# Patient Record
Sex: Male | Born: 1985 | Race: Black or African American | Hispanic: No | Marital: Married | Smoking: Never smoker
Health system: Southern US, Community
[De-identification: ages and names within clinical notes are randomized; demographics above are authoritative.]

## PROBLEM LIST (undated history)

## (undated) ENCOUNTER — Emergency Department (HOSPITAL_COMMUNITY): Payer: Self-pay | Source: Home / Self Care

## (undated) DIAGNOSIS — I10 Essential (primary) hypertension: Secondary | ICD-10-CM

## (undated) DIAGNOSIS — E291 Testicular hypofunction: Secondary | ICD-10-CM

## (undated) HISTORY — DX: Morbid (severe) obesity due to excess calories: E66.01

## (undated) HISTORY — DX: Testicular hypofunction: E29.1

## (undated) HISTORY — PX: TONSILLECTOMY AND ADENOIDECTOMY: SUR1326

## (undated) HISTORY — DX: Essential (primary) hypertension: I10

---

## 2017-11-02 DIAGNOSIS — H52223 Regular astigmatism, bilateral: Secondary | ICD-10-CM | POA: Diagnosis not present

## 2018-03-15 DIAGNOSIS — R5383 Other fatigue: Secondary | ICD-10-CM | POA: Diagnosis not present

## 2018-03-15 DIAGNOSIS — Z13220 Encounter for screening for lipoid disorders: Secondary | ICD-10-CM | POA: Diagnosis not present

## 2018-03-15 DIAGNOSIS — I10 Essential (primary) hypertension: Secondary | ICD-10-CM | POA: Diagnosis not present

## 2018-03-15 DIAGNOSIS — E559 Vitamin D deficiency, unspecified: Secondary | ICD-10-CM | POA: Diagnosis not present

## 2018-03-15 DIAGNOSIS — R6882 Decreased libido: Secondary | ICD-10-CM | POA: Diagnosis not present

## 2018-03-15 DIAGNOSIS — R7302 Impaired glucose tolerance (oral): Secondary | ICD-10-CM | POA: Diagnosis not present

## 2018-04-02 ENCOUNTER — Encounter (INDEPENDENT_AMBULATORY_CARE_PROVIDER_SITE_OTHER): Payer: Self-pay | Admitting: Internal Medicine

## 2018-04-05 ENCOUNTER — Other Ambulatory Visit (HOSPITAL_COMMUNITY): Payer: Self-pay | Admitting: Internal Medicine

## 2018-04-05 ENCOUNTER — Other Ambulatory Visit: Payer: Self-pay | Admitting: Internal Medicine

## 2018-04-05 DIAGNOSIS — E291 Testicular hypofunction: Secondary | ICD-10-CM | POA: Diagnosis not present

## 2018-04-05 DIAGNOSIS — R6882 Decreased libido: Secondary | ICD-10-CM | POA: Diagnosis not present

## 2018-04-05 DIAGNOSIS — I10 Essential (primary) hypertension: Secondary | ICD-10-CM | POA: Diagnosis not present

## 2018-04-08 ENCOUNTER — Encounter (INDEPENDENT_AMBULATORY_CARE_PROVIDER_SITE_OTHER): Payer: Self-pay | Admitting: Internal Medicine

## 2018-04-29 ENCOUNTER — Telehealth: Payer: Self-pay | Admitting: Internal Medicine

## 2018-05-07 ENCOUNTER — Encounter (HOSPITAL_COMMUNITY): Payer: Self-pay

## 2018-05-07 ENCOUNTER — Ambulatory Visit (HOSPITAL_COMMUNITY): Payer: Self-pay

## 2018-05-26 ENCOUNTER — Ambulatory Visit (HOSPITAL_COMMUNITY)
Admission: RE | Admit: 2018-05-26 | Discharge: 2018-05-26 | Disposition: A | Discharge status: Home / Self Care | Payer: 59 | Source: Ambulatory Visit | Attending: Internal Medicine | Admitting: Internal Medicine

## 2018-05-26 ENCOUNTER — Other Ambulatory Visit: Payer: Self-pay

## 2018-05-26 DIAGNOSIS — E291 Testicular hypofunction: Secondary | ICD-10-CM | POA: Insufficient documentation

## 2018-05-27 ENCOUNTER — Ambulatory Visit (HOSPITAL_COMMUNITY)
Admission: RE | Admit: 2018-05-27 | Discharge: 2018-05-27 | Disposition: A | Discharge status: Home / Self Care | Payer: 59 | Source: Ambulatory Visit | Attending: Internal Medicine | Admitting: Internal Medicine

## 2018-05-27 ENCOUNTER — Other Ambulatory Visit (HOSPITAL_COMMUNITY): Payer: Self-pay | Admitting: Internal Medicine

## 2018-05-27 DIAGNOSIS — E291 Testicular hypofunction: Secondary | ICD-10-CM

## 2018-05-28 DIAGNOSIS — R7302 Impaired glucose tolerance (oral): Secondary | ICD-10-CM | POA: Diagnosis not present

## 2018-05-28 DIAGNOSIS — E291 Testicular hypofunction: Secondary | ICD-10-CM | POA: Diagnosis not present

## 2018-05-28 DIAGNOSIS — I10 Essential (primary) hypertension: Secondary | ICD-10-CM | POA: Diagnosis not present

## 2018-07-05 ENCOUNTER — Ambulatory Visit (INDEPENDENT_AMBULATORY_CARE_PROVIDER_SITE_OTHER): Payer: Self-pay | Admitting: Internal Medicine

## 2018-07-20 DIAGNOSIS — E291 Testicular hypofunction: Secondary | ICD-10-CM | POA: Diagnosis not present

## 2018-07-20 DIAGNOSIS — I10 Essential (primary) hypertension: Secondary | ICD-10-CM | POA: Diagnosis not present

## 2018-07-20 DIAGNOSIS — R5383 Other fatigue: Secondary | ICD-10-CM | POA: Diagnosis not present

## 2018-09-14 ENCOUNTER — Other Ambulatory Visit: Payer: Self-pay

## 2018-09-14 ENCOUNTER — Ambulatory Visit (INDEPENDENT_AMBULATORY_CARE_PROVIDER_SITE_OTHER): Payer: 59 | Admitting: Internal Medicine

## 2018-09-14 ENCOUNTER — Encounter (INDEPENDENT_AMBULATORY_CARE_PROVIDER_SITE_OTHER): Payer: Self-pay | Admitting: Internal Medicine

## 2018-09-14 VITALS — BP 150/100 | HR 72 | Ht 68.0 in | Wt 396.4 lb

## 2018-09-14 DIAGNOSIS — I10 Essential (primary) hypertension: Secondary | ICD-10-CM | POA: Insufficient documentation

## 2018-09-14 DIAGNOSIS — R7303 Prediabetes: Secondary | ICD-10-CM

## 2018-09-14 DIAGNOSIS — E291 Testicular hypofunction: Secondary | ICD-10-CM | POA: Diagnosis not present

## 2018-09-14 NOTE — Progress Notes (Addendum)
Wellness Office Visit  Subjective:  Patient ID: Garrett Le, male    DOB: December 24, 1985  Age: 33 y.o. MRN: [id number]  CC: This man comes in for follow-up of morbid obesity, hypogonadism, hypertension.  HPI Garrett Le Says he has been aggressive about intermittent fasting and he did 14-day fast recently and was able to lose approximately 30 pounds.  Then he started eating at which point, as expected, he gained weight.  However, he felt very good when he was fasting, he maintain hydration and salt intake.He continues with testosterone therapy once a week which seems to be helping him feel better overall with more energy and he continues on lisinopril for hypertension although he is not taking this as consistently as I had recommended.  He stopped taking desiccated NP thyroid as he preferred not to take it for now.  Past Medical History:  Diagnosis Date  . Hypertension   . Hypogonadism in male   . Morbid obesity ([place])     Past Surgical History:  Procedure Laterality Date  . TONSILLECTOMY AND ADENOIDECTOMY      Family History  Problem Relation Age of Onset  . Hypertension Mother     Social History   Socioeconomic History  . Marital status: Married    Spouse name: Not on file  . Number of children: Not on file  . Years of education: Not on file  . Highest education level: Not on file  Occupational History  . Occupation: Engineer, petroleum  Social Needs  . Financial resource strain: Not on file  . Food insecurity    Worry: Not on file    Inability: Not on file  . Transportation needs    Medical: Not on file    Non-medical: Not on file  Tobacco Use  . Smoking status: Never Smoker  . Smokeless tobacco: Never Used  Substance and Sexual Activity  . Alcohol use: Yes    Comment: Occasional alcohol.  . Drug use: Not on file  . Sexual activity: Not on file  Lifestyle  . Physical activity    Days per week: Not on file    Minutes per session: Not on file  .  Stress: Not on file  Relationships  . Social Herbalist on phone: Not on file    Gets together: Not on file    Attends religious service: Not on file    Active member of club or organization: Not on file    Attends meetings of clubs or organizations: Not on file    Relationship status: Married  . Intimate partner violence    Fear of current or ex partner: Not on file    Emotionally abused: Not on file    Physically abused: Not on file    Forced sexual activity: Not on file  Other Topics Concern  . Not on file  Social History Narrative  . Not on file    ROS Review of Systems  Objective:   Today's Vitals: BP (!) 150/100   Pulse 72   Ht 5\' 8"  (1.727 m)   Wt (!) 396 lb 6.4 oz (179.8 kg)   BMI 60.27 kg/m   Physical Exam    The last time I saw him, he weighed 413 pounds so he has lost 17 pounds.  He does feel much better.  His blood pressure is still elevated but improved from the last time.  He is alert and orientated without any obvious focal neurological signs.  Nutrition He continues  with intermittent fasting and he is currently on a day 3 of a 21-day fast. Exercise He has not begun to exercise on a significant basis but he does walk.  He has not done any strength training. Bio Identical Hormones Testosterone therapy is being used off label for symptoms of testosterone deficiency and benefits that it produces based on several studies.  These benefits include decreasing body fat, increasing in lean muscle mass and increasing in bone density.  There is improvement of memory, cognition.  There is improvement in exercise tolerance and endurance.  Testosterone therapy has also been shown to be protective against coronary artery disease, cerebrovascular disease, diabetes, hypertension and degenerative joint disease. I have discussed with the patient the FDA warnings regarding testosterone therapy, benefits and side effects and modes of administration as well as monitoring  blood levels and side effects  on a regular basis The patient is agreeable that testosterone therapy should be an integral part of his/her wellness,quality of life and prevention of chronic disease.   Assessment & Plan:   1.  He will continue with nutritional recommendations from the past, intermittent fasting combined with eating healthier based on low carbohydrate, moderate protein and higher fat diet. 2.  Continue with testosterone therapy once a week as this seems to be beneficial for his severe hypogonadism. 3.  Continue with lisinopril and be consistent with this. 4.  I have discussed with him exercise and especially the importance of strength training to maintain muscle mass while he does intermittent fasting. 5.  Blood work was taken today including complete metabolic panel, hemoglobin A1c, testosterone levels as he was prediabetic previously. 6.  Further recommendations will depend on blood results and I will follow-up with him in about 3 months time.  I told him that I expect him to lose at least 12 to 15 pounds and he is keen to lose more. I spent 30 to 35 minutes with this patient face-to-face, more than 50% of the time was involved in discussing his overall health and the importance of nutrition and exercise to achieve his goals.  Problem List Items Addressed This Visit    Morbid obesity (HCC)   Hypogonadism in male - Primary   Relevant Orders   Testosterone, Free, Total, SHBG   Hypertension   Relevant Medications   lisinopril (ZESTRIL) 20 MG tablet   Other Relevant Orders   COMPLETE METABOLIC PANEL WITH GFR    Other Visit Diagnoses    Prediabetes       Relevant Orders   Hemoglobin A1c      Outpatient Encounter Medications as of 09/14/2018  Medication Sig  . Cholecalciferol (VITAMIN D-3) 125 MCG (5000 UT) TABS Take 2 tablets by mouth daily at 12 noon.  Marland Kitchen. lisinopril (ZESTRIL) 20 MG tablet Take 20 mg by mouth daily.  . Testosterone Cypionate 200 MG/ML SOLN Inject 100 mg as  directed every 7 (seven) days.   No facility-administered encounter medications on file as of 09/14/2018.     Follow-up: No follow-ups on file.   Wilson SingerNimish C Gosrani, MD

## 2018-09-14 NOTE — Patient Instructions (Signed)

## 2018-10-14 DIAGNOSIS — M5441 Lumbago with sciatica, right side: Secondary | ICD-10-CM | POA: Diagnosis not present

## 2018-10-14 DIAGNOSIS — M5136 Other intervertebral disc degeneration, lumbar region: Secondary | ICD-10-CM | POA: Diagnosis not present

## 2018-10-14 DIAGNOSIS — M62830 Muscle spasm of back: Secondary | ICD-10-CM | POA: Diagnosis not present

## 2018-10-14 DIAGNOSIS — M546 Pain in thoracic spine: Secondary | ICD-10-CM | POA: Diagnosis not present

## 2018-10-15 DIAGNOSIS — M5441 Lumbago with sciatica, right side: Secondary | ICD-10-CM | POA: Diagnosis not present

## 2018-10-15 DIAGNOSIS — M62830 Muscle spasm of back: Secondary | ICD-10-CM | POA: Diagnosis not present

## 2018-10-15 DIAGNOSIS — M5136 Other intervertebral disc degeneration, lumbar region: Secondary | ICD-10-CM | POA: Diagnosis not present

## 2018-10-15 DIAGNOSIS — M546 Pain in thoracic spine: Secondary | ICD-10-CM | POA: Diagnosis not present

## 2018-10-18 DIAGNOSIS — M62830 Muscle spasm of back: Secondary | ICD-10-CM | POA: Diagnosis not present

## 2018-10-18 DIAGNOSIS — M5441 Lumbago with sciatica, right side: Secondary | ICD-10-CM | POA: Diagnosis not present

## 2018-10-18 DIAGNOSIS — M546 Pain in thoracic spine: Secondary | ICD-10-CM | POA: Diagnosis not present

## 2018-10-18 DIAGNOSIS — M5136 Other intervertebral disc degeneration, lumbar region: Secondary | ICD-10-CM | POA: Diagnosis not present

## 2018-10-20 DIAGNOSIS — M5136 Other intervertebral disc degeneration, lumbar region: Secondary | ICD-10-CM | POA: Diagnosis not present

## 2018-10-20 DIAGNOSIS — M546 Pain in thoracic spine: Secondary | ICD-10-CM | POA: Diagnosis not present

## 2018-10-20 DIAGNOSIS — M62830 Muscle spasm of back: Secondary | ICD-10-CM | POA: Diagnosis not present

## 2018-10-20 DIAGNOSIS — M5441 Lumbago with sciatica, right side: Secondary | ICD-10-CM | POA: Diagnosis not present

## 2018-10-22 DIAGNOSIS — M5441 Lumbago with sciatica, right side: Secondary | ICD-10-CM | POA: Diagnosis not present

## 2018-10-22 DIAGNOSIS — M5136 Other intervertebral disc degeneration, lumbar region: Secondary | ICD-10-CM | POA: Diagnosis not present

## 2018-10-22 DIAGNOSIS — M546 Pain in thoracic spine: Secondary | ICD-10-CM | POA: Diagnosis not present

## 2018-10-22 DIAGNOSIS — M62830 Muscle spasm of back: Secondary | ICD-10-CM | POA: Diagnosis not present

## 2018-10-25 DIAGNOSIS — M546 Pain in thoracic spine: Secondary | ICD-10-CM | POA: Diagnosis not present

## 2018-10-25 DIAGNOSIS — M62830 Muscle spasm of back: Secondary | ICD-10-CM | POA: Diagnosis not present

## 2018-10-25 DIAGNOSIS — M5441 Lumbago with sciatica, right side: Secondary | ICD-10-CM | POA: Diagnosis not present

## 2018-10-25 DIAGNOSIS — M5136 Other intervertebral disc degeneration, lumbar region: Secondary | ICD-10-CM | POA: Diagnosis not present

## 2018-10-27 DIAGNOSIS — M546 Pain in thoracic spine: Secondary | ICD-10-CM | POA: Diagnosis not present

## 2018-10-27 DIAGNOSIS — M5136 Other intervertebral disc degeneration, lumbar region: Secondary | ICD-10-CM | POA: Diagnosis not present

## 2018-10-27 DIAGNOSIS — M62830 Muscle spasm of back: Secondary | ICD-10-CM | POA: Diagnosis not present

## 2018-10-27 DIAGNOSIS — M5441 Lumbago with sciatica, right side: Secondary | ICD-10-CM | POA: Diagnosis not present

## 2018-11-01 DIAGNOSIS — M546 Pain in thoracic spine: Secondary | ICD-10-CM | POA: Diagnosis not present

## 2018-11-01 DIAGNOSIS — M5136 Other intervertebral disc degeneration, lumbar region: Secondary | ICD-10-CM | POA: Diagnosis not present

## 2018-11-01 DIAGNOSIS — M5441 Lumbago with sciatica, right side: Secondary | ICD-10-CM | POA: Diagnosis not present

## 2018-11-01 DIAGNOSIS — M62830 Muscle spasm of back: Secondary | ICD-10-CM | POA: Diagnosis not present

## 2018-11-03 DIAGNOSIS — M546 Pain in thoracic spine: Secondary | ICD-10-CM | POA: Diagnosis not present

## 2018-11-03 DIAGNOSIS — M62830 Muscle spasm of back: Secondary | ICD-10-CM | POA: Diagnosis not present

## 2018-11-03 DIAGNOSIS — M5441 Lumbago with sciatica, right side: Secondary | ICD-10-CM | POA: Diagnosis not present

## 2018-11-03 DIAGNOSIS — M5136 Other intervertebral disc degeneration, lumbar region: Secondary | ICD-10-CM | POA: Diagnosis not present

## 2018-11-05 DIAGNOSIS — M5441 Lumbago with sciatica, right side: Secondary | ICD-10-CM | POA: Diagnosis not present

## 2018-11-05 DIAGNOSIS — M546 Pain in thoracic spine: Secondary | ICD-10-CM | POA: Diagnosis not present

## 2018-11-05 DIAGNOSIS — M62830 Muscle spasm of back: Secondary | ICD-10-CM | POA: Diagnosis not present

## 2018-11-05 DIAGNOSIS — M5136 Other intervertebral disc degeneration, lumbar region: Secondary | ICD-10-CM | POA: Diagnosis not present

## 2018-11-08 DIAGNOSIS — M5136 Other intervertebral disc degeneration, lumbar region: Secondary | ICD-10-CM | POA: Diagnosis not present

## 2018-11-08 DIAGNOSIS — M62830 Muscle spasm of back: Secondary | ICD-10-CM | POA: Diagnosis not present

## 2018-11-08 DIAGNOSIS — M5441 Lumbago with sciatica, right side: Secondary | ICD-10-CM | POA: Diagnosis not present

## 2018-11-08 DIAGNOSIS — M546 Pain in thoracic spine: Secondary | ICD-10-CM | POA: Diagnosis not present

## 2018-11-10 DIAGNOSIS — M5441 Lumbago with sciatica, right side: Secondary | ICD-10-CM | POA: Diagnosis not present

## 2018-11-10 DIAGNOSIS — M62830 Muscle spasm of back: Secondary | ICD-10-CM | POA: Diagnosis not present

## 2018-11-10 DIAGNOSIS — M546 Pain in thoracic spine: Secondary | ICD-10-CM | POA: Diagnosis not present

## 2018-11-10 DIAGNOSIS — M5136 Other intervertebral disc degeneration, lumbar region: Secondary | ICD-10-CM | POA: Diagnosis not present

## 2018-11-12 DIAGNOSIS — M546 Pain in thoracic spine: Secondary | ICD-10-CM | POA: Diagnosis not present

## 2018-11-12 DIAGNOSIS — M5136 Other intervertebral disc degeneration, lumbar region: Secondary | ICD-10-CM | POA: Diagnosis not present

## 2018-11-12 DIAGNOSIS — M62830 Muscle spasm of back: Secondary | ICD-10-CM | POA: Diagnosis not present

## 2018-11-12 DIAGNOSIS — M5441 Lumbago with sciatica, right side: Secondary | ICD-10-CM | POA: Diagnosis not present

## 2018-11-15 DIAGNOSIS — Z20828 Contact with and (suspected) exposure to other viral communicable diseases: Secondary | ICD-10-CM | POA: Diagnosis not present

## 2018-11-23 ENCOUNTER — Other Ambulatory Visit (INDEPENDENT_AMBULATORY_CARE_PROVIDER_SITE_OTHER): Payer: Self-pay | Admitting: Internal Medicine

## 2018-11-23 MED ORDER — TESTOSTERONE CYPIONATE 200 MG/ML IJ SOLN: 100.0000 mg | INTRAMUSCULAR | 1 refills | Status: DC

## 2018-12-22 ENCOUNTER — Ambulatory Visit (INDEPENDENT_AMBULATORY_CARE_PROVIDER_SITE_OTHER): Payer: 59 | Admitting: Internal Medicine

## 2018-12-30 DIAGNOSIS — Z20828 Contact with and (suspected) exposure to other viral communicable diseases: Secondary | ICD-10-CM | POA: Diagnosis not present

## 2019-02-07 ENCOUNTER — Ambulatory Visit (INDEPENDENT_AMBULATORY_CARE_PROVIDER_SITE_OTHER): Payer: 59 | Admitting: Internal Medicine

## 2019-02-23 DIAGNOSIS — H52223 Regular astigmatism, bilateral: Secondary | ICD-10-CM | POA: Diagnosis not present

## 2019-02-24 DIAGNOSIS — R7303 Prediabetes: Secondary | ICD-10-CM | POA: Diagnosis not present

## 2019-02-24 DIAGNOSIS — I10 Essential (primary) hypertension: Secondary | ICD-10-CM | POA: Diagnosis not present

## 2019-02-25 LAB — COMPLETE METABOLIC PANEL WITH GFR
AG Ratio: 1.2 (calc) (ref 1.0–2.5)
ALT: 22 U/L (ref 9–46)
AST: 13 U/L (ref 10–40)
Albumin: 4.3 g/dL (ref 3.6–5.1)
Alkaline phosphatase (APISO): 63 U/L (ref 36–130)
BUN: 16 mg/dL (ref 7–25)
CO2: 29 mmol/L (ref 20–32)
Calcium: 9.8 mg/dL (ref 8.6–10.3)
Chloride: 98 mmol/L (ref 98–110)
Creat: 0.81 mg/dL (ref 0.60–1.35)
GFR, Est African American: 133 mL/min/{1.73_m2} (ref >=60)
GFR, Est Non African American: 115 mL/min/{1.73_m2} (ref >=60)
Globulin: 3.7 g/dL (calc) (ref 1.9–3.7)
Glucose, Bld: 87 mg/dL (ref 65–139)
Potassium: 4.3 mmol/L (ref 3.5–5.3)
Sodium: 135 mmol/L (ref 135–146)
Total Bilirubin: 0.5 mg/dL (ref 0.2–1.2)
Total Protein: 8 g/dL (ref 6.1–8.1)

## 2019-02-25 LAB — HEMOGLOBIN A1C
Hgb A1c MFr Bld: 5.9 % of total Hgb — ABNORMAL HIGH (ref <5.7)
Mean Plasma Glucose: 123 (calc)
eAG (mmol/L): 6.8 (calc)

## 2019-03-02 DIAGNOSIS — M62830 Muscle spasm of back: Secondary | ICD-10-CM | POA: Diagnosis not present

## 2019-03-02 DIAGNOSIS — M5442 Lumbago with sciatica, left side: Secondary | ICD-10-CM | POA: Diagnosis not present

## 2019-03-04 DIAGNOSIS — M5442 Lumbago with sciatica, left side: Secondary | ICD-10-CM | POA: Diagnosis not present

## 2019-03-04 DIAGNOSIS — M62830 Muscle spasm of back: Secondary | ICD-10-CM | POA: Diagnosis not present

## 2019-03-07 DIAGNOSIS — M5442 Lumbago with sciatica, left side: Secondary | ICD-10-CM | POA: Diagnosis not present

## 2019-03-07 DIAGNOSIS — M62830 Muscle spasm of back: Secondary | ICD-10-CM | POA: Diagnosis not present

## 2019-03-09 DIAGNOSIS — M5442 Lumbago with sciatica, left side: Secondary | ICD-10-CM | POA: Diagnosis not present

## 2019-03-09 DIAGNOSIS — M62830 Muscle spasm of back: Secondary | ICD-10-CM | POA: Diagnosis not present

## 2019-03-11 DIAGNOSIS — M62830 Muscle spasm of back: Secondary | ICD-10-CM | POA: Diagnosis not present

## 2019-03-11 DIAGNOSIS — M5442 Lumbago with sciatica, left side: Secondary | ICD-10-CM | POA: Diagnosis not present

## 2019-03-14 DIAGNOSIS — M5442 Lumbago with sciatica, left side: Secondary | ICD-10-CM | POA: Diagnosis not present

## 2019-03-14 DIAGNOSIS — M62830 Muscle spasm of back: Secondary | ICD-10-CM | POA: Diagnosis not present

## 2019-03-16 DIAGNOSIS — M62830 Muscle spasm of back: Secondary | ICD-10-CM | POA: Diagnosis not present

## 2019-03-16 DIAGNOSIS — M5442 Lumbago with sciatica, left side: Secondary | ICD-10-CM | POA: Diagnosis not present

## 2019-03-18 DIAGNOSIS — M5442 Lumbago with sciatica, left side: Secondary | ICD-10-CM | POA: Diagnosis not present

## 2019-03-18 DIAGNOSIS — M62830 Muscle spasm of back: Secondary | ICD-10-CM | POA: Diagnosis not present

## 2019-03-21 DIAGNOSIS — M5442 Lumbago with sciatica, left side: Secondary | ICD-10-CM | POA: Diagnosis not present

## 2019-03-21 DIAGNOSIS — M62830 Muscle spasm of back: Secondary | ICD-10-CM | POA: Diagnosis not present

## 2019-03-22 ENCOUNTER — Ambulatory Visit (INDEPENDENT_AMBULATORY_CARE_PROVIDER_SITE_OTHER): Payer: 59 | Admitting: Internal Medicine

## 2019-03-22 ENCOUNTER — Other Ambulatory Visit: Payer: Self-pay

## 2019-03-22 ENCOUNTER — Encounter (INDEPENDENT_AMBULATORY_CARE_PROVIDER_SITE_OTHER): Payer: Self-pay | Admitting: Internal Medicine

## 2019-03-22 VITALS — BP 130/85 | HR 78 | Temp 97.6°F | Ht 69.0 in | Wt 395.8 lb

## 2019-03-22 DIAGNOSIS — R7303 Prediabetes: Secondary | ICD-10-CM

## 2019-03-22 DIAGNOSIS — E291 Testicular hypofunction: Secondary | ICD-10-CM

## 2019-03-22 DIAGNOSIS — I10 Essential (primary) hypertension: Secondary | ICD-10-CM

## 2019-03-22 NOTE — Progress Notes (Signed)
Metrics: Intervention Frequency ACO  Documented Smoking Status Yearly  Screened one or more times in 24 months  Cessation Counseling or  Active cessation medication Past 24 months  Past 24 months   Guideline developer: UpToDate (See UpToDate for funding source) Date Released: 2014       Wellness Office Visit  Subjective:  Patient ID: Garrett Le, male    DOB: April 12, 1985  Age: 34 y.o. MRN: [id number]  CC: This man comes in after a long hiatus for follow-up of his hypogonadism, morbid obesity, hypertension. HPI  He says that he discontinued taking lisinopril for his hypertension and also discontinued desiccated NP thyroid.  He continues to take testosterone therapy once a week but notices that his energy levels tend to wane towards the end of the week.  He last took an injection 6 days ago. He is now on a ketogenic diet along with intermittent fasting.  He tries to drink about a gallon of water every day. Past Medical History:  Diagnosis Date  . Hypertension   . Hypogonadism in male   . Morbid obesity (HCC)       Family History  Problem Relation Age of Onset  . Hypertension Mother     Social History   Social History Narrative  . Not on file   Social History   Tobacco Use  . Smoking status: Never Smoker  . Smokeless tobacco: Never Used  Substance Use Topics  . Alcohol use: Yes    Comment: Occasional alcohol.    Current Meds  Medication Sig  . Testosterone Cypionate 200 MG/ML SOLN Inject 100 mg as directed every 7 (seven) days.        Objective:   Today's Vitals: BP 130/85 (BP Location: Left Arm, Patient Position: Sitting, Cuff Size: Normal)   Pulse 78   Temp 97.6 F (36.4 C) (Temporal)   Ht 5\' 9"  (1.753 m)   Wt (!) 395 lb 12.8 oz (179.5 kg)   SpO2 98%   BMI 58.45 kg/m  Vitals with BMI 03/22/2019 09/14/2018 07/20/2018  Height 5\' 9"  5\' 8"  5\' 8"   Weight 395 lbs 13 oz 396 lbs 6 oz 413 lbs  BMI 58.42 60.29 62.81  Systolic 130 150 09/20/2018  Diastolic 85 100 100   Pulse 78 72 -     Physical Exam It looks systemically well and surprisingly his blood pressure is reasonably well controlled without medications today.  He has lost about a pound since September of last year.  He is alert and orientated.      Assessment   1. Essential hypertension   2. Prediabetes   3. Hypogonadism in male   4. Morbid obesity (HCC)       Tests ordered Orders Placed This Encounter  Procedures  . Testosterone Total,Free,Bio, Males  . T3, free  . TSH     Plan: 1. Blood work is ordered above. 2. He will continue with testosterone therapy and I suspect we may need to raise the dose. 3. He will continue on nutritional plan with intermittent fasting and a ketogenic diet. 4. I will check his thyroid function again because I think he would benefit from thyroid medications which he did have in the past. 5. Today I spent 30 minutes with this patient discussing all his various issues above.   No orders of the defined types were placed in this encounter.   , MD

## 2019-03-22 NOTE — Patient Instructions (Signed)
[facility] Dietary Recommendations for Weight Loss What to Avoid . Avoid added sugars o Often added sugar can be found in processed foods such as many condiments, dry cereals, cakes, cookies, chips, crisps, crackers, candies, sweetened drinks, etc.  o Read labels and AVOID/DECREASE use of foods with the following in their ingredient list: Sugar, fructose, high fructose corn syrup, sucrose, glucose, maltose, dextrose, molasses, cane sugar, brown sugar, any type of syrup, agave nectar, etc.   . Avoid snacking in between meals . Avoid foods made with flour o If you are going to eat food made with flour, choose those made with whole-grains; and, minimize your consumption as much as is tolerable . Avoid processed foods o These foods are generally stocked in the middle of the grocery store. Focus on shopping on the perimeter of the grocery.  . Avoid Meat  o We recommend following a plant-based diet at [name]. Thus, we recommend avoiding meat as a general rule. Consider eating beans, legumes, eggs, and/or dairy products for regular protein sources o If you plan on eating meat limit to 4 ounces of meat at a time and choose lean options such as Fish, chicken, turkey. Avoid red meat intake such as pork and/or steak What to Include . Vegetables o GREEN LEAFY VEGETABLES: Kale, spinach, mustard greens, collard greens, cabbage, broccoli, etc. o OTHER: Asparagus, cauliflower, eggplant, carrots, peas, Brussel sprouts, tomatoes, bell peppers, zucchini, beets, cucumbers, etc. . Grains, seeds, and legumes o Beans: kidney beans, black eyed peas, garbanzo beans, black beans, pinto beans, etc. o Whole, unrefined grains: brown rice, barley, bulgur, oatmeal, etc. . Healthy fats  o Avoid highly processed fats such as vegetable oil o Examples of healthy fats: avocado, olives, virgin olive oil, dark chocolate (?72% Cocoa), nuts (peanuts, almonds, walnuts, cashews, pecans, etc.) . None to Low  Intake of Animal Sources of Protein o Meat sources: chicken, turkey, salmon, tuna. Limit to 4 ounces of meat at one time. o Consider limiting dairy sources, but when choosing dairy focus on: PLAIN Greek yogurt, cottage cheese, high-protein milk . Fruit o Choose berries  When to Eat . Intermittent Fasting: o Choosing not to eat for a specific time period, but DO FOCUS ON HYDRATION when fasting o Multiple Techniques: - Time Restricted Eating: eat 3 meals in a day, each meal lasting no more than 60 minutes, no snacks between meals - 16-18 hour fast: fast for 16 to 18 hours up to 7 days a week. Often suggested to start with 2-3 nonconsecutive days per week.  . Remember the time you sleep is counted as fasting.  . Examples of eating schedule: Fast from 7:00pm-11:00am. Eat between 11:00am-7:00pm.  - 24-hour fast: fast for 24 hours up to every other day. Often suggested to start with 1 day per week . Remember the time you sleep is counted as fasting . Examples of eating schedule:  o Eating day: eat 2-3 meals on your eating day. If doing 2 meals, each meal should last no more than 90 minutes. If doing 3 meals, each meal should last no more than 60 minutes. Finish last meal by 7:00pm. o Fasting day: Fast until 7:00pm.  o IF YOU FEEL UNWELL FOR ANY REASON/IN ANY WAY WHEN FASTING, STOP FASTING BY EATING A NUTRITIOUS SNACK OR LIGHT MEAL o ALWAYS FOCUS ON HYDRATION DURING FASTS - Acceptable Hydration sources: water, broths, tea/coffee (black tea/coffee is best but using a small amount of whole-fat dairy products in coffee/tea is acceptable).  -   Poor Hydration Sources: anything with sugar or artificial sweeteners added to it  These recommendations have been developed for patients that are actively receiving medical care from either Dr. Oluwadamilola Rosamond or Sarah Gray, DNP, NP-C at [name]. These recommendations are developed for patients with specific medical conditions and are not meant to be  distributed or used by others that are not actively receiving care from either provider listed above at [name]. It is not appropriate to participate in the above eating plans without proper medical supervision.   Reference: Fung, J. The obesity code. [place]: [place]; 2016.   

## 2019-03-23 DIAGNOSIS — M62830 Muscle spasm of back: Secondary | ICD-10-CM | POA: Diagnosis not present

## 2019-03-23 DIAGNOSIS — M5442 Lumbago with sciatica, left side: Secondary | ICD-10-CM | POA: Diagnosis not present

## 2019-03-23 LAB — TIQ-MISC

## 2019-03-25 ENCOUNTER — Other Ambulatory Visit (INDEPENDENT_AMBULATORY_CARE_PROVIDER_SITE_OTHER): Payer: Self-pay | Admitting: Internal Medicine

## 2019-03-25 ENCOUNTER — Encounter (INDEPENDENT_AMBULATORY_CARE_PROVIDER_SITE_OTHER): Payer: Self-pay | Admitting: Internal Medicine

## 2019-03-25 DIAGNOSIS — M62830 Muscle spasm of back: Secondary | ICD-10-CM | POA: Diagnosis not present

## 2019-03-25 DIAGNOSIS — M5442 Lumbago with sciatica, left side: Secondary | ICD-10-CM | POA: Diagnosis not present

## 2019-03-25 MED ORDER — THYROID 60 MG PO TABS: 60.0000 mg | ORAL_TABLET | Freq: Every day | ORAL | 3 refills | Status: AC

## 2019-03-28 DIAGNOSIS — M5442 Lumbago with sciatica, left side: Secondary | ICD-10-CM | POA: Diagnosis not present

## 2019-03-28 DIAGNOSIS — M62830 Muscle spasm of back: Secondary | ICD-10-CM | POA: Diagnosis not present

## 2019-03-29 DIAGNOSIS — M62830 Muscle spasm of back: Secondary | ICD-10-CM | POA: Diagnosis not present

## 2019-03-29 DIAGNOSIS — M5442 Lumbago with sciatica, left side: Secondary | ICD-10-CM | POA: Diagnosis not present

## 2019-03-29 LAB — TESTOSTERONE TOTAL,FREE,BIO, MALES
Albumin: 4.4 g/dL (ref 3.6–5.1)
Sex Hormone Binding: 9 nmol/L — ABNORMAL LOW (ref 10–50)
Testosterone, Bioavailable: 302.3 ng/dL (ref >=110.0)
Testosterone, Free: 150.2 pg/mL (ref 46.0–224.0)
Testosterone: 480 ng/dL (ref 250–827)

## 2019-03-29 LAB — T3, FREE: T3, Free: 3.2 pg/mL (ref 2.3–4.2)

## 2019-03-29 LAB — TSH: TSH: 2.45 mIU/L (ref 0.40–4.50)

## 2019-03-30 DIAGNOSIS — M62830 Muscle spasm of back: Secondary | ICD-10-CM | POA: Diagnosis not present

## 2019-03-30 DIAGNOSIS — M5442 Lumbago with sciatica, left side: Secondary | ICD-10-CM | POA: Diagnosis not present

## 2019-04-04 ENCOUNTER — Telehealth (INDEPENDENT_AMBULATORY_CARE_PROVIDER_SITE_OTHER): Payer: Self-pay | Admitting: Internal Medicine

## 2019-04-04 ENCOUNTER — Other Ambulatory Visit (INDEPENDENT_AMBULATORY_CARE_PROVIDER_SITE_OTHER): Payer: Self-pay | Admitting: Internal Medicine

## 2019-04-04 DIAGNOSIS — G4730 Sleep apnea, unspecified: Secondary | ICD-10-CM

## 2019-04-04 NOTE — Telephone Encounter (Signed)
noted 

## 2019-04-04 NOTE — Telephone Encounter (Signed)
This man wants a CPAP machine for his probable sleep apnea but we need to refer him to [facility] neurology first for a sleep study.  I will put the order in.

## 2019-04-07 DIAGNOSIS — M5442 Lumbago with sciatica, left side: Secondary | ICD-10-CM | POA: Diagnosis not present

## 2019-04-07 DIAGNOSIS — M62830 Muscle spasm of back: Secondary | ICD-10-CM | POA: Diagnosis not present

## 2019-04-10 ENCOUNTER — Encounter (INDEPENDENT_AMBULATORY_CARE_PROVIDER_SITE_OTHER): Payer: Self-pay | Admitting: Internal Medicine

## 2019-04-11 ENCOUNTER — Other Ambulatory Visit: Payer: Self-pay | Admitting: Orthopedic Surgery

## 2019-04-11 ENCOUNTER — Other Ambulatory Visit (INDEPENDENT_AMBULATORY_CARE_PROVIDER_SITE_OTHER): Payer: Self-pay | Admitting: Internal Medicine

## 2019-04-11 DIAGNOSIS — M545 Low back pain, unspecified: Secondary | ICD-10-CM

## 2019-04-11 NOTE — Telephone Encounter (Signed)
Pt has to now get medicine fill at [facility].

## 2019-04-12 ENCOUNTER — Encounter: Payer: Self-pay | Admitting: Neurology

## 2019-04-13 ENCOUNTER — Other Ambulatory Visit: Payer: Self-pay

## 2019-04-13 ENCOUNTER — Ambulatory Visit (INDEPENDENT_AMBULATORY_CARE_PROVIDER_SITE_OTHER): Payer: 59 | Admitting: Neurology

## 2019-04-13 ENCOUNTER — Encounter: Payer: Self-pay | Admitting: Neurology

## 2019-04-13 VITALS — BP 174/101 | HR 86 | Temp 97.6°F | Ht 70.0 in | Wt 397.0 lb

## 2019-04-13 DIAGNOSIS — Z6841 Body Mass Index (BMI) 40.0 and over, adult: Secondary | ICD-10-CM | POA: Diagnosis not present

## 2019-04-13 DIAGNOSIS — Z9989 Dependence on other enabling machines and devices: Secondary | ICD-10-CM | POA: Diagnosis not present

## 2019-04-13 DIAGNOSIS — G4733 Obstructive sleep apnea (adult) (pediatric): Secondary | ICD-10-CM | POA: Diagnosis not present

## 2019-04-13 NOTE — Progress Notes (Signed)
Subjective:    Patient ID: Garrett Le is a 34 y.o. male.  HPI     Huston Foley, MD, PhD [facility] [address] [address] [place], [place] [address]  Dear Dr. Karilyn Cota,   I saw your patient, Garrett Le, upon your kind request in my sleep clinic today for initial consultation of his sleep disorder, in particular, his Dx of severe obstructive sleep apnea.  The patient is unaccompanied today.  As you know, Mr. Garrett Le is a 34 year old right-handed gentleman with an underlying medical history of hypertension, hypogonadism, and morbid obesity with a BMI of over 50, who Was previously diagnosed with severe obstructive sleep apnea.  He had a sleep study in [place] in April 2017 which indicated severe obstructive sleep apnea with an AHI of 89/h, O2 nadir of 47%. I reviewed your office note from 03/22/2019.  I reviewed his AutoPap compliance data from 03/14/2019 through 04/12/2019, which is a total of 30 days, during which time he used his machine 25 days with percent use days greater than 4 hours at 77%, indicating adequate compliance with an average usage of 5 hours and 37 minutes, residual AHI borderline at 5.8/h, 95th percentile of pressure at 13.9 cm with a range of 5 to 20 cm, leak intermittently very high with the 95th percentile at 71.5 L/min.  His Epworth sleepiness score is 3 out of 24, fatigue  severity score is 24 out of 63. He reports full compliance with his AutoPap machine.  He also has an older machine which is about a year older than his current machine, current machine is about 36 years old.  He uses a large full facemask from [employer] as he recalls.  He does need new supplies and has not been able to establish with a local DME company.  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  His sleep study was in [place].  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  He benefits from AutoPap therapy and sleeps better with it.  His recent lapse in  treatment was secondary to an out-of-town trip and he forgot to take his machine as he was in a hurry at the time.  He lives with his wife, they have no kids, no pets in the house.  He has a TV in the bedroom but does not watch it.  He is a non-smoker and does not drink caffeine daily, alcohol occasionally.  He is working on weight loss and thus far has lost about 20 pounds.  He does not sleep very much.  He may sleep an average of 4 hours on any given night.  Bedtime is around midnight and rise time generally at 6 AM for work but he is often awake by 4 AM.  He now drives locally only and comes home every night.  He had a tonsillectomy as a child.  He is not aware of any family history of OSA.  He has not worked since January.  He reports that he had an injury to his back.  His Past Medical History Is Significant For: Past Medical History:  Diagnosis Date  . Hypertension   . Hypogonadism in male   . Morbid obesity (HCC)     His Past Surgical History Is Significant For: Past Surgical History:  Procedure Laterality Date  . TONSILLECTOMY AND ADENOIDECTOMY      His Family History Is Significant For: Family History  Problem Relation Age of Onset  .  Hypertension Mother     His Social History Is Significant For: Social History   Socioeconomic History  . Marital status: Married    Spouse name: Not on file  . Number of children: Not on file  . Years of education: Not on file  . Highest education level: Not on file  Occupational History  . Occupation: Engineer, petroleum  Tobacco Use  . Smoking status: Never Smoker  . Smokeless tobacco: Never Used  Substance and Sexual Activity  . Alcohol use: Yes    Comment: Occasional alcohol.  . Drug use: Not on file  . Sexual activity: Not on file  Other Topics Concern  . Not on file  Social History Narrative  . Not on file   Social Determinants of Health   Financial Resource Strain:   . Difficulty of Paying Living Expenses:   Food  Insecurity:   . Worried About Charity fundraiser in the Last Year:   . Arboriculturist in the Last Year:   Transportation Needs:   . Film/video editor (Medical):   Marland Kitchen Lack of Transportation (Non-Medical):   Physical Activity:   . Days of Exercise per Week:   . Minutes of Exercise per Session:   Stress:   . Feeling of Stress :   Social Connections: Unknown  . Frequency of Communication with Friends and Family: Not on file  . Frequency of Social Gatherings with Friends and Family: Not on file  . Attends Religious Services: Not on file  . Active Member of Clubs or Organizations: Not on file  . Attends Archivist Meetings: Not on file  . Marital Status: Married    His Allergies Are:  Not on File:   His Current Medications Are:  Outpatient Encounter Medications as of 04/13/2019  Medication Sig  . meloxicam (MOBIC) 15 MG tablet Take 15 mg by mouth daily as needed.  . methocarbamol (ROBAXIN) 500 MG tablet Take 500 mg by mouth every 6 (six) hours as needed.  . Testosterone Cypionate 200 MG/ML SOLN Inject 100 mg as directed every 7 (seven) days.  Marland Kitchen thyroid (NP THYROID) 60 MG tablet Take 1 tablet (60 mg total) by mouth daily before breakfast.   No facility-administered encounter medications on file as of 04/13/2019.  :  Review of Systems:  Out of a complete 14 point review of systems, all are reviewed and negative with the exception of these symptoms as listed below: Review of Systems  Neurological:       Pt presents today. Pt is a DOT driver. Back in 2017 in [place] he had a SS and states it was through his job. 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 3 yrs later he has moved here and is with a new company. He is currently still using a RESMED machine but since he has changed locations of jobs unable to get supplies and new job requires the work up to be completed. He is not established with a DME company. Epworth Sleepiness Scale 0= would never doze 1= slight chance of  dozing 2= moderate chance of dozing 3= high chance of dozing  Sitting and reading:0 Watching TV:0 Sitting inactive in a public place (ex. Theater or meeting):0 As a passenger in a car for an hour without a break:0 Lying down to rest in the afternoon:1 Sitting and talking to someone:0 Sitting quietly after lunch (no alcohol):0 In a car, while stopped in traffic:0 Total:1  epworth is on compliant CPAP use.  Objective:  Neurological Exam  Physical Exam Physical Examination:   Vitals:   04/13/19 0856  BP: (!) 174/101  Pulse: 86  Temp: 97.6 F (36.4 C)    General Examination: The patient is a very pleasant 34 y.o. male in no acute distress. He appears well-developed and well-nourished and well groomed.   HEENT: Normocephalic, atraumatic, pupils are equal, round and reactive to light, extraocular tracking is good without limitation to gaze excursion or nystagmus noted. Hearing is grossly intact. Face is symmetric with normal facial animation. Speech is clear with no dysarthria noted. There is no hypophonia. There is no lip, neck/head, jaw or voice tremor. Neck is supple with full range of passive and active motion. There are no carotid bruits on auscultation. Oropharynx exam reveals: mild mouth dryness, adequate dental hygiene and marked airway crowding, due to Mallampati class III, thicker soft palate, larger uvula, wider tongue, tonsils are absent.  Neck circumference is 20 three-quarter inches.  He has a moderate overbite.  Chest: Clear to auscultation without wheezing, rhonchi or crackles noted.  Heart: S1+S2+0, regular and normal without murmurs, rubs or gallops noted.   Abdomen: Soft, non-tender and non-distended with normal bowel sounds appreciated on auscultation.  Extremities: There is no pitting edema in the distal lower extremities bilaterally.   Skin: Warm and dry without trophic changes noted.   Musculoskeletal: exam reveals no obvious joint deformities,  tenderness or joint swelling or erythema, he reports LBP without radiation.   Neurologically:  Mental status: The patient is awake, alert and oriented in all 4 spheres. His immediate and remote memory, attention, language skills and fund of knowledge are appropriate. There is no evidence of aphasia, agnosia, apraxia or anomia. Speech is clear with normal prosody and enunciation. Thought process is linear. Mood is normal and affect is normal.  Cranial nerves II - XII are as described above under HEENT exam.  Motor exam: Normal bulk, strength and tone is noted. There is no tremor, Romberg is negative. Fine motor skills and coordination: grossly intact.  Cerebellar testing: No dysmetria or intention tremor. There is no truncal or gait ataxia.  Sensory exam: intact to light touch in the upper and lower extremities.  Gait, station and balance: He stands somewhat slowly without difficulty.  Posture is age-appropriate, stands somewhat wider based but he can stand narrow based, gait is unremarkable, no limp, no problems turning, tandem walk is unremarkable as well.   Assessment and Plan:   In summary, Rashidi Loh is a very pleasant 34 y.o.-year old male with an underlying medical history of hypertension, hypogonadism, and morbid obesity with a BMI of over 50, who presents for evaluation of his obstructive sleep apnea.  He was diagnosed with severe obstructive sleep apnea in April 2017 with an AHI of 89/h, O2 nadir of 47% at the time.  He is compliant with his AutoPap.  He needs new supplies.  He needs to establish with a local DME company since he moved from [place].  He is commended on his treatment adherence.  He is strongly encouraged to pursue aggressive weight loss as it may help reduce the severity of his sleep disordered breathing, he had severe desaturations.  We will proceed with an updated sleep test in the form of a home sleep test to update his diagnosis and after that I will send a  prescription for supplies to a local DME company, he would prefer [place] location.  His machine is about 36 years old and seems to work fine.  He does feel that sometimes the humidity is too high and the temperature of the air is too hot and it makes the mask sweaty.  We provided additional new filters for his machine for now so he can at least change the filter and also a new large [employer] full facemask F 20 so he can take at home and at least change his mask as it has been over a year since his last supplies he indicates.  I plan to see him back after his home sleep test.  He is willing and very motivated to continue with his AutoPap machine.  He is advised to try to not skip any nights.  We talked about the risks and ramifications of severe obstructive sleep apnea.   I answered all his questions today and the patient.  Thank you very much for allowing me to participate in the care of this nice patient. If I can be of any further assistance to you please do not hesitate to call me at [phone].  Sincerely,   Huston Foley, MD, PhD

## 2019-04-13 NOTE — Patient Instructions (Signed)
Thank you for choosing [facility] for your sleep related care! It was nice to meet you today! I appreciate that you entrust me with your sleep related healthcare concerns. I hope, I was able to address at least some of your concerns today, and that I can help you feel reassured and also get better.    Here is what we discussed today and what we came up with as our plan for you:  Please continue using your autoPAP regularly. While your insurance may require that you use PAP at least 4 hours each night on 70% of the nights, I recommend, that you not skip any nights and use it throughout the night if you can. Getting used to PAP and staying with the treatment long term does take time and patience and discipline. Untreated obstructive sleep apnea when it is moderate to severe can have an adverse impact on cardiovascular health and raise her risk for heart disease, arrhythmias, hypertension, congestive heart failure, stroke and diabetes. Untreated obstructive sleep apnea causes sleep disruption, nonrestorative sleep, and sleep deprivation. This can have an impact on your day to day functioning and cause daytime sleepiness and impairment of cognitive function, memory loss, mood disturbance, and problems focussing. Using PAP regularly can improve these symptoms.  We have new filters for you and a large full face mask from [employer] for now.  We will refer you to a local DME company (durable medical equipment), hopefully, in [place].   Based on your symptoms and your exam I believe you are still at risk for obstructive sleep apnea and would benefit from reevaluation as it has been about 3 years and you need new supplies. Therefore, I think we should proceed with a sleep study to determine how severe your sleep apnea is. If you have more than mild OSA, I want you to consider ongoing treatment with CPAP. Please remember, the risks and ramifications of moderate to severe obstructive sleep apnea or  OSA are: Cardiovascular disease, including congestive heart failure, stroke, difficult to control hypertension, arrhythmias, and even type 2 diabetes has been linked to untreated OSA. Sleep apnea causes disruption of sleep and sleep deprivation in most cases, which, in turn, can cause recurrent headaches, problems with memory, mood, concentration, focus, and vigilance. Most people with untreated sleep apnea report excessive daytime sleepiness, which can affect their ability to drive. Please do not drive if you feel sleepy.   I will likely see you back after your sleep study to go over the test results and where to go from there. We will call you after your sleep study to advise about the results (most likely, you will hear from [place], my nurse) and to set up an appointment at the time, as necessary.    Our sleep lab administrative assistant will call you to schedule your sleep study. If you don't hear back from her by about 2 weeks from now, please feel free to call her at [phone]. You can leave a message with your phone number and concerns, if you get the voicemail box. She will call back as soon as possible.

## 2019-04-24 ENCOUNTER — Ambulatory Visit
Admission: RE | Admit: 2019-04-24 | Discharge: 2019-04-24 | Disposition: A | Discharge status: Home / Self Care | Payer: 59 | Source: Ambulatory Visit | Attending: Orthopedic Surgery | Admitting: Orthopedic Surgery

## 2019-04-24 ENCOUNTER — Other Ambulatory Visit: Payer: Self-pay

## 2019-04-24 DIAGNOSIS — M545 Low back pain, unspecified: Secondary | ICD-10-CM

## 2019-04-24 DIAGNOSIS — M5126 Other intervertebral disc displacement, lumbar region: Secondary | ICD-10-CM | POA: Diagnosis not present

## 2019-04-24 DIAGNOSIS — M5136 Other intervertebral disc degeneration, lumbar region: Secondary | ICD-10-CM | POA: Diagnosis not present

## 2019-04-26 DIAGNOSIS — M545 Low back pain: Secondary | ICD-10-CM | POA: Diagnosis not present

## 2019-04-26 DIAGNOSIS — Z6841 Body Mass Index (BMI) 40.0 and over, adult: Secondary | ICD-10-CM | POA: Diagnosis not present

## 2019-05-02 DIAGNOSIS — M545 Low back pain: Secondary | ICD-10-CM | POA: Diagnosis not present

## 2019-05-02 DIAGNOSIS — R531 Weakness: Secondary | ICD-10-CM | POA: Diagnosis not present

## 2019-05-02 DIAGNOSIS — M2560 Stiffness of unspecified joint, not elsewhere classified: Secondary | ICD-10-CM | POA: Diagnosis not present

## 2019-05-04 ENCOUNTER — Ambulatory Visit (INDEPENDENT_AMBULATORY_CARE_PROVIDER_SITE_OTHER): Payer: 59 | Admitting: Neurology

## 2019-05-04 DIAGNOSIS — G4733 Obstructive sleep apnea (adult) (pediatric): Secondary | ICD-10-CM

## 2019-05-04 DIAGNOSIS — Z6841 Body Mass Index (BMI) 40.0 and over, adult: Secondary | ICD-10-CM

## 2019-05-05 DIAGNOSIS — M545 Low back pain: Secondary | ICD-10-CM | POA: Diagnosis not present

## 2019-05-05 DIAGNOSIS — R531 Weakness: Secondary | ICD-10-CM | POA: Diagnosis not present

## 2019-05-05 DIAGNOSIS — M2560 Stiffness of unspecified joint, not elsewhere classified: Secondary | ICD-10-CM | POA: Diagnosis not present

## 2019-05-06 DIAGNOSIS — R531 Weakness: Secondary | ICD-10-CM | POA: Diagnosis not present

## 2019-05-06 DIAGNOSIS — M545 Low back pain: Secondary | ICD-10-CM | POA: Diagnosis not present

## 2019-05-06 DIAGNOSIS — M2560 Stiffness of unspecified joint, not elsewhere classified: Secondary | ICD-10-CM | POA: Diagnosis not present

## 2019-05-09 DIAGNOSIS — R531 Weakness: Secondary | ICD-10-CM | POA: Diagnosis not present

## 2019-05-09 DIAGNOSIS — M545 Low back pain: Secondary | ICD-10-CM | POA: Diagnosis not present

## 2019-05-09 DIAGNOSIS — M2560 Stiffness of unspecified joint, not elsewhere classified: Secondary | ICD-10-CM | POA: Diagnosis not present

## 2019-05-10 ENCOUNTER — Other Ambulatory Visit: Payer: Self-pay

## 2019-05-10 ENCOUNTER — Encounter (INDEPENDENT_AMBULATORY_CARE_PROVIDER_SITE_OTHER): Payer: Self-pay | Admitting: Internal Medicine

## 2019-05-10 ENCOUNTER — Telehealth: Payer: Self-pay

## 2019-05-10 ENCOUNTER — Ambulatory Visit (INDEPENDENT_AMBULATORY_CARE_PROVIDER_SITE_OTHER): Payer: 59 | Admitting: Internal Medicine

## 2019-05-10 DIAGNOSIS — E291 Testicular hypofunction: Secondary | ICD-10-CM

## 2019-05-10 DIAGNOSIS — Z9989 Dependence on other enabling machines and devices: Secondary | ICD-10-CM

## 2019-05-10 DIAGNOSIS — G4733 Obstructive sleep apnea (adult) (pediatric): Secondary | ICD-10-CM

## 2019-05-10 DIAGNOSIS — I10 Essential (primary) hypertension: Secondary | ICD-10-CM

## 2019-05-10 NOTE — Progress Notes (Signed)
Patient referred by Dr. Karilyn Cota for his prior Dx of OSA, seen by me on 04/13/19, HST on 05/05/19.    Please call and notify the patient that the recent home sleep test confirmed his Dx of Severe obstructive sleep apnea. He has been on autoPAP and I recommend he continue with it. We referred him at the time of his visit to a new DME as [place], please verify. Please advise him to be fully compliant with treatment and arrange for a FU in sleep clinic with one of the NPs or myself in 3 months.

## 2019-05-10 NOTE — Procedures (Signed)
Patient Information     First Name: Garrett Le Last Name: Mccahill ID: [id number]  Birth Date: 04-04-1985 Age: 34 Gender: Male  Referring Provider: Lilly Cove, MD BMI: 56.8 (W=396 lb, H=5' 10'')  Neck Circ.:  21 '' Epworth:  1/24   Sleep Study Information    Study Date: 05/05/2019 S/H/A Version: [id number] / 4.0.1515 / 51  History:     34 year old right-handed gentleman with an underlying medical history of hypertension, hypogonadism, and morbid obesity with a BMI of over 50, who was previously diagnosed with severe obstructive sleep apnea.  He has been on autoPAP therapy Summary & Diagnosis:     Severe OSA Recommendations:     This home sleep test demonstrates severe obstructive sleep apnea with a total AHI of 93/hour and O2 nadir of 72%. The patient has been on AutoPAP therapy and will be advised to be fully compliant with therapy. If there is difficulty with autoPAP tolerance, treatment with positive airway pressure, in the form of CPAP, may be considered, which will require a full night CPAP titration study for proper treatment settings, O2 monitoring and mask fitting. Please note that untreated obstructive sleep apnea may carry additional perioperative morbidity. Patients with significant obstructive sleep apnea should receive perioperative PAP therapy and the surgeons and particularly the anesthesiologist should be informed of the diagnosis and the severity of the sleep disordered breathing. The patient should be cautioned not to drive, work at heights, or operate dangerous or heavy equipment when tired or sleepy. Review and reiteration of good sleep hygiene measures should be pursued with any patient. Other causes of the patient's symptoms, including circadian rhythm disturbances, an underlying mood disorder, medication effect and/or an underlying medical problem cannot be ruled out based on this test. Clinical correlation is recommended. The patient and his referring provider will be notified  of the test results. The patient will be seen in follow up in sleep clinic at [facility].  I certify that I have reviewed the raw data recording prior to the issuance of this report in accordance with the standards of the American Academy of Sleep Medicine (AASM).  Huston Foley, MD, PhD [facility]) Diplomat, ABPN (Neurology and Sleep)                    Sleep Summary  Oxygen Saturation Statistics   Start Study Time: End Study Time: Total Recording Time:  12:32:03 AM 6:55:56 AM 6 h, 23 min  Total Sleep Time % REM of Sleep Time:  5 h, 15 min  8.2    Mean: 92 Minimum: 72 Maximum: 100  Mean of Desaturations Nadirs (%):   84  Oxygen Desaturation. %:   4-9 10-20 >20 Total  Events Number Total    84  303 20.5 73.9  23 5.6  410 100.0  Oxygen Saturation <90 <=88 <85 <80 <70  Duration (minutes): Sleep % 108.2 34.3 93.5 39.4 29.6 12.5 5.8 1.8 0.0 0.0     Respiratory Indices      Total Events REM NREM All Night  pRDI:  447  pAHI:  440 ODI:  410  pAHIc: 101  % CSR: 0.0 N/A N/A N/A N/A N/A N/A N/A N/A 94.5 93.0 86.7 21.9       Pulse Rate Statistics during Sleep (BPM)      Mean: 76 Minimum: 48 Maximum: 114    Indices are calculated using technically valid sleep time of  4 h, 43 min. Central-Indices are calculated using technically  valid sleep time of  4  h, 36 min.  pRDI/pAHI are calculated using oxi desaturations ? 3% REM/NREM indices appear only if REM time >  30 min. Sit    Body Position Statistics  Position Supine Prone Right Left Non-Supine  Sleep (min) 222.5 36.0 0.0 57.0 93.0  Sleep % 70.5 11.4 0.0 18.1 29.5  pRDI 95.7 88.3 N/A 91.7 90.9  pAHI 93.8 88.3 N/A 91.7 90.9  ODI 87.8 91.9 N/A 80.6 83.3     Snoring Statistics Snoring Level (dB) >40 >50 >60 >70 >80 >Threshold (45)  Sleep (min) 230.7 54.0 31.9 4.4 0.0 110.0  Sleep % 73.1 17.1 10.1 1.4 0.0 34.9    Mean: 46 dB Sleep Stages Chart                                                                              pAHI=93.0                                                                                              Mild              Moderate                    Severe                                                 5              15                    30  * Reference values are according to AASM guidelines
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
## 2019-05-10 NOTE — Telephone Encounter (Signed)
-----   Message from Garrett Foley, MD sent at 05/10/2019  8:01 AM EDT ----- Patient referred by Dr. Karilyn Cota for his prior Dx of OSA, seen by me on 04/13/19, HST on 05/05/19.    Please call and notify the patient that the recent home sleep test confirmed his Dx of Severe obstructive sleep apnea. He has been on autoPAP and I recommend he continue with it. We referred him at the time of his visit to a new DME as [place], please verify. Please advise him to be fully compliant with treatment and arrange for a FU in sleep clinic with one of the NPs or myself in 3 months.

## 2019-05-10 NOTE — Telephone Encounter (Addendum)
I reached out to the pt and advised of results. Pt is agreeable to continuing auto pap. Pt will use [facility] as DME ( they have a office in [place]).   I have sent order to [facility]. Pt reports he is currently residing in [place] but he may be moving to [place] in the next two months. I have tentatively scheduled for July. He will call back if he moves to [place].

## 2019-05-11 DIAGNOSIS — M545 Low back pain: Secondary | ICD-10-CM | POA: Diagnosis not present

## 2019-05-11 DIAGNOSIS — M2560 Stiffness of unspecified joint, not elsewhere classified: Secondary | ICD-10-CM | POA: Diagnosis not present

## 2019-05-11 DIAGNOSIS — R531 Weakness: Secondary | ICD-10-CM | POA: Diagnosis not present

## 2019-05-11 LAB — TESTOSTERONE TOTAL,FREE,BIO, MALES
Albumin: 4 g/dL (ref 3.6–5.1)
Sex Hormone Binding: 11 nmol/L (ref 10–50)
Testosterone, Bioavailable: 395.1 ng/dL (ref >=110.0)
Testosterone, Free: 214.8 pg/mL (ref 46.0–224.0)
Testosterone: 666 ng/dL (ref 250–827)

## 2019-05-13 DIAGNOSIS — M545 Low back pain: Secondary | ICD-10-CM | POA: Diagnosis not present

## 2019-05-13 DIAGNOSIS — M2560 Stiffness of unspecified joint, not elsewhere classified: Secondary | ICD-10-CM | POA: Diagnosis not present

## 2019-05-13 DIAGNOSIS — R531 Weakness: Secondary | ICD-10-CM | POA: Diagnosis not present

## 2019-05-16 DIAGNOSIS — M545 Low back pain: Secondary | ICD-10-CM | POA: Diagnosis not present

## 2019-05-16 DIAGNOSIS — M2560 Stiffness of unspecified joint, not elsewhere classified: Secondary | ICD-10-CM | POA: Diagnosis not present

## 2019-05-16 DIAGNOSIS — R531 Weakness: Secondary | ICD-10-CM | POA: Diagnosis not present

## 2019-05-20 ENCOUNTER — Other Ambulatory Visit (INDEPENDENT_AMBULATORY_CARE_PROVIDER_SITE_OTHER): Payer: Self-pay | Admitting: Internal Medicine

## 2019-05-20 DIAGNOSIS — R531 Weakness: Secondary | ICD-10-CM | POA: Diagnosis not present

## 2019-05-20 DIAGNOSIS — M2560 Stiffness of unspecified joint, not elsewhere classified: Secondary | ICD-10-CM | POA: Diagnosis not present

## 2019-05-20 DIAGNOSIS — M545 Low back pain: Secondary | ICD-10-CM | POA: Diagnosis not present

## 2019-05-23 DIAGNOSIS — M2560 Stiffness of unspecified joint, not elsewhere classified: Secondary | ICD-10-CM | POA: Diagnosis not present

## 2019-05-23 DIAGNOSIS — M545 Low back pain: Secondary | ICD-10-CM | POA: Diagnosis not present

## 2019-05-23 DIAGNOSIS — R531 Weakness: Secondary | ICD-10-CM | POA: Diagnosis not present

## 2019-05-25 ENCOUNTER — Telehealth (INDEPENDENT_AMBULATORY_CARE_PROVIDER_SITE_OTHER): Payer: Self-pay | Admitting: Internal Medicine

## 2019-05-25 NOTE — Telephone Encounter (Signed)
Patient is calling and states [facility] is saying this medication is needing a prior authorization. Patient would also like a call back when this has been taken care of.   testosterone cypionate (DEPOTESTOSTERONE CYPIONATE) 200 MG/ML injection

## 2019-05-25 NOTE — Telephone Encounter (Signed)
Please call this patient and let him know that we are working on this.  Thanks.

## 2019-05-25 NOTE — Telephone Encounter (Signed)
Routing to Dr. Gosrani  

## 2019-05-30 DIAGNOSIS — M2560 Stiffness of unspecified joint, not elsewhere classified: Secondary | ICD-10-CM | POA: Diagnosis not present

## 2019-05-30 DIAGNOSIS — M545 Low back pain: Secondary | ICD-10-CM | POA: Diagnosis not present

## 2019-05-30 DIAGNOSIS — R531 Weakness: Secondary | ICD-10-CM | POA: Diagnosis not present

## 2019-05-31 DIAGNOSIS — M545 Low back pain: Secondary | ICD-10-CM | POA: Diagnosis not present

## 2019-06-03 DIAGNOSIS — G4733 Obstructive sleep apnea (adult) (pediatric): Secondary | ICD-10-CM | POA: Diagnosis not present

## 2019-06-03 DIAGNOSIS — M545 Low back pain: Secondary | ICD-10-CM | POA: Diagnosis not present

## 2019-06-03 DIAGNOSIS — M2560 Stiffness of unspecified joint, not elsewhere classified: Secondary | ICD-10-CM | POA: Diagnosis not present

## 2019-06-03 DIAGNOSIS — R531 Weakness: Secondary | ICD-10-CM | POA: Diagnosis not present

## 2019-06-06 DIAGNOSIS — M545 Low back pain: Secondary | ICD-10-CM | POA: Diagnosis not present

## 2019-06-06 DIAGNOSIS — M2560 Stiffness of unspecified joint, not elsewhere classified: Secondary | ICD-10-CM | POA: Diagnosis not present

## 2019-06-06 DIAGNOSIS — R531 Weakness: Secondary | ICD-10-CM | POA: Diagnosis not present

## 2019-06-10 DIAGNOSIS — M545 Low back pain: Secondary | ICD-10-CM | POA: Diagnosis not present

## 2019-06-10 DIAGNOSIS — R531 Weakness: Secondary | ICD-10-CM | POA: Diagnosis not present

## 2019-06-10 DIAGNOSIS — M2560 Stiffness of unspecified joint, not elsewhere classified: Secondary | ICD-10-CM | POA: Diagnosis not present

## 2019-06-17 DIAGNOSIS — R531 Weakness: Secondary | ICD-10-CM | POA: Diagnosis not present

## 2019-06-17 DIAGNOSIS — M545 Low back pain: Secondary | ICD-10-CM | POA: Diagnosis not present

## 2019-06-17 DIAGNOSIS — M2560 Stiffness of unspecified joint, not elsewhere classified: Secondary | ICD-10-CM | POA: Diagnosis not present

## 2019-06-20 DIAGNOSIS — R531 Weakness: Secondary | ICD-10-CM | POA: Diagnosis not present

## 2019-06-20 DIAGNOSIS — M2560 Stiffness of unspecified joint, not elsewhere classified: Secondary | ICD-10-CM | POA: Diagnosis not present

## 2019-06-20 DIAGNOSIS — M545 Low back pain: Secondary | ICD-10-CM | POA: Diagnosis not present

## 2019-06-23 ENCOUNTER — Ambulatory Visit (INDEPENDENT_AMBULATORY_CARE_PROVIDER_SITE_OTHER): Payer: 59 | Admitting: Internal Medicine

## 2019-06-24 DIAGNOSIS — M545 Low back pain: Secondary | ICD-10-CM | POA: Diagnosis not present

## 2019-06-24 DIAGNOSIS — M2560 Stiffness of unspecified joint, not elsewhere classified: Secondary | ICD-10-CM | POA: Diagnosis not present

## 2019-06-24 DIAGNOSIS — R531 Weakness: Secondary | ICD-10-CM | POA: Diagnosis not present

## 2019-06-27 DIAGNOSIS — R531 Weakness: Secondary | ICD-10-CM | POA: Diagnosis not present

## 2019-06-27 DIAGNOSIS — M2560 Stiffness of unspecified joint, not elsewhere classified: Secondary | ICD-10-CM | POA: Diagnosis not present

## 2019-06-27 DIAGNOSIS — M545 Low back pain: Secondary | ICD-10-CM | POA: Diagnosis not present

## 2019-07-01 DIAGNOSIS — R531 Weakness: Secondary | ICD-10-CM | POA: Diagnosis not present

## 2019-07-01 DIAGNOSIS — M545 Low back pain: Secondary | ICD-10-CM | POA: Diagnosis not present

## 2019-07-01 DIAGNOSIS — M2560 Stiffness of unspecified joint, not elsewhere classified: Secondary | ICD-10-CM | POA: Diagnosis not present

## 2019-07-04 DIAGNOSIS — M545 Low back pain: Secondary | ICD-10-CM | POA: Diagnosis not present

## 2019-07-04 DIAGNOSIS — G4733 Obstructive sleep apnea (adult) (pediatric): Secondary | ICD-10-CM | POA: Diagnosis not present

## 2019-07-06 ENCOUNTER — Ambulatory Visit (INDEPENDENT_AMBULATORY_CARE_PROVIDER_SITE_OTHER): Payer: 59 | Admitting: Internal Medicine

## 2019-07-06 ENCOUNTER — Encounter (INDEPENDENT_AMBULATORY_CARE_PROVIDER_SITE_OTHER): Payer: Self-pay | Admitting: Internal Medicine

## 2019-07-06 ENCOUNTER — Other Ambulatory Visit: Payer: Self-pay

## 2019-07-06 VITALS — BP 140/85 | HR 85 | Temp 98.2°F | Ht 69.0 in | Wt >= 6400 oz

## 2019-07-06 DIAGNOSIS — I10 Essential (primary) hypertension: Secondary | ICD-10-CM | POA: Diagnosis not present

## 2019-07-06 DIAGNOSIS — R5381 Other malaise: Secondary | ICD-10-CM

## 2019-07-06 DIAGNOSIS — R5383 Other fatigue: Secondary | ICD-10-CM | POA: Diagnosis not present

## 2019-07-06 DIAGNOSIS — E291 Testicular hypofunction: Secondary | ICD-10-CM
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
## 2019-07-07 DIAGNOSIS — E291 Testicular hypofunction: Secondary | ICD-10-CM | POA: Diagnosis not present

## 2019-07-07 DIAGNOSIS — R5381 Other malaise: Secondary | ICD-10-CM | POA: Diagnosis not present

## 2019-07-07 DIAGNOSIS — I10 Essential (primary) hypertension: Secondary | ICD-10-CM | POA: Diagnosis not present

## 2019-07-07 DIAGNOSIS — R5383 Other fatigue: Secondary | ICD-10-CM | POA: Diagnosis not present

## 2019-07-08 DIAGNOSIS — M2560 Stiffness of unspecified joint, not elsewhere classified: Secondary | ICD-10-CM | POA: Diagnosis not present

## 2019-07-08 DIAGNOSIS — M545 Low back pain: Secondary | ICD-10-CM | POA: Diagnosis not present

## 2019-07-08 DIAGNOSIS — R531 Weakness: Secondary | ICD-10-CM | POA: Diagnosis not present

## 2019-07-08 LAB — TESTOSTERONE TOTAL,FREE,BIO, MALES
Albumin: 4.1 g/dL (ref 3.6–5.1)
Sex Hormone Binding: 13 nmol/L (ref 10–50)
Testosterone, Bioavailable: 680 ng/dL — ABNORMAL HIGH (ref >=110.0)
Testosterone, Free: 361.2 pg/mL — ABNORMAL HIGH (ref 46.0–224.0)
Testosterone: 1097 ng/dL — ABNORMAL HIGH (ref 250–827)

## 2019-07-08 LAB — T3, FREE: T3, Free: 4 pg/mL (ref 2.3–4.2)

## 2019-07-21 ENCOUNTER — Ambulatory Visit: Payer: Self-pay | Admitting: Adult Health

## 2019-08-22 ENCOUNTER — Ambulatory Visit (INDEPENDENT_AMBULATORY_CARE_PROVIDER_SITE_OTHER): Payer: 59 | Admitting: Internal Medicine

## 2019-09-07 ENCOUNTER — Encounter (INDEPENDENT_AMBULATORY_CARE_PROVIDER_SITE_OTHER): Payer: Self-pay | Admitting: Internal Medicine

## 2019-09-15 ENCOUNTER — Other Ambulatory Visit (INDEPENDENT_AMBULATORY_CARE_PROVIDER_SITE_OTHER): Payer: Self-pay

## 2019-09-15 MED ORDER — TESTOSTERONE CYPIONATE 200 MG/ML IM SOLN: 120.0000 mg | INTRAMUSCULAR | 1 refills | Status: AC

## 2019-09-27 ENCOUNTER — Ambulatory Visit: Payer: 59 | Admitting: Family Medicine

## 2019-10-13 ENCOUNTER — Telehealth (INDEPENDENT_AMBULATORY_CARE_PROVIDER_SITE_OTHER): Payer: Self-pay

## 2019-10-13 NOTE — Telephone Encounter (Signed)
Acted patient to find out what Dr Karilyn Cota was sent  subpoena to appear in court on his behalf. Pt stated he fell at work in Jan of 2021. [employer] on the floor in a warehouse. Pt stated he told them who his PCP was for the records and incident. But did not know we would be contacted.  Pt also stated to cancel his appt today, because he moved back to [place]. Pt was explained to find a new PCP  In his network  And location to be treated. Also that he will need to complete  ROI form to have records forwarded to new provider. Wish him and wife the best and to be safe. Thank him for being a part of our practice as a patient.

## 2019-10-17 ENCOUNTER — Ambulatory Visit (INDEPENDENT_AMBULATORY_CARE_PROVIDER_SITE_OTHER): Payer: Self-pay | Admitting: Internal Medicine

## 2021-10-10 IMAGING — MR MR LUMBAR SPINE W/O CM
4 of 5 series · 18 of 48 positions shown · non-contrast
Comparison: None available.

CLINICAL DATA: Initial evaluation for low back pain with left
buttock pain since fall at work in Monday January, 2019.

EXAM:
MRI LUMBAR SPINE WITHOUT CONTRAST
TECHNIQUE: Multiplanar, multisequence MR imaging of the lumbar spine was
performed. No intravenous contrast was administered.

[Series 6: T2 · sagittal · 4.0mm · 0.73mm/px · 6 of 15 slices shown (1 of 2)]
[im 1/15]
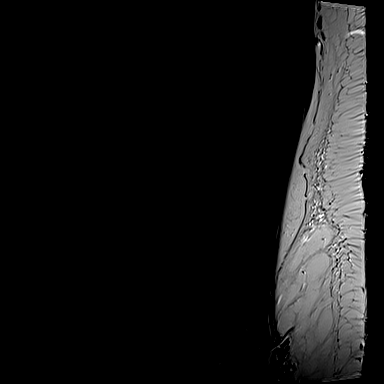
[im 3/15]
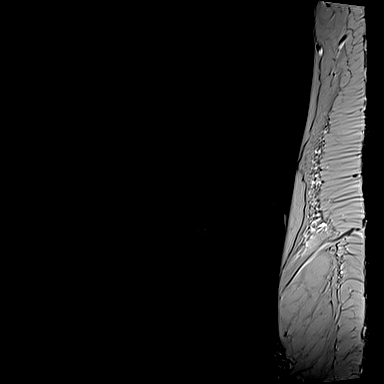
[im 6/15]
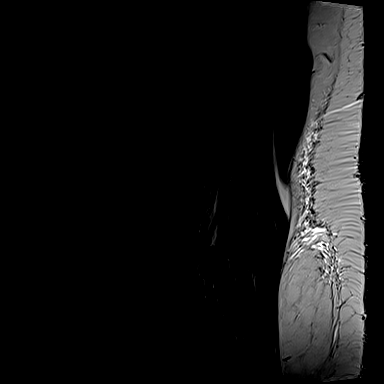
[im 9/15]
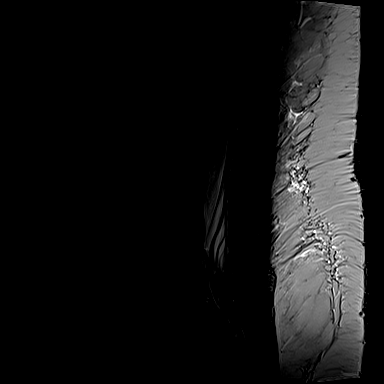
[im 12/15]
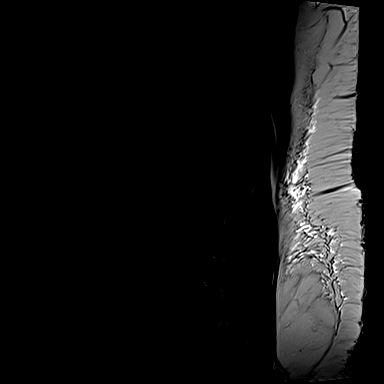
[im 15/15]
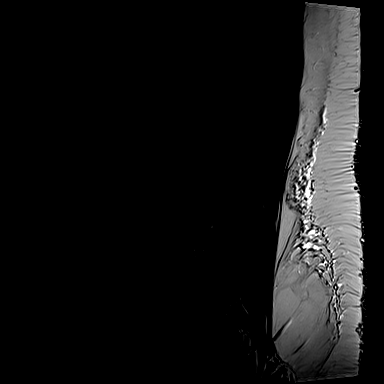

[Series 8: T1 · sagittal · 4.0mm · 0.73mm/px · 3 of 15 slices shown (1 of 2)]
[im 3/15]
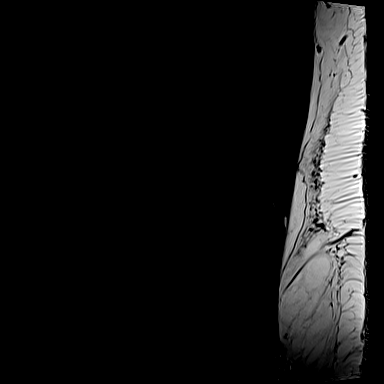
[im 9/15]
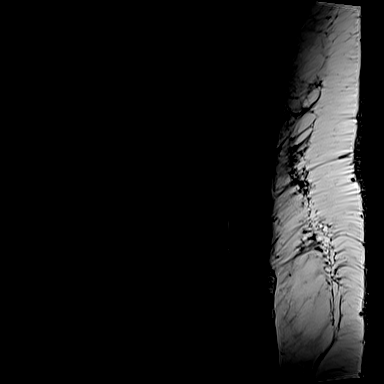
[im 15/15]
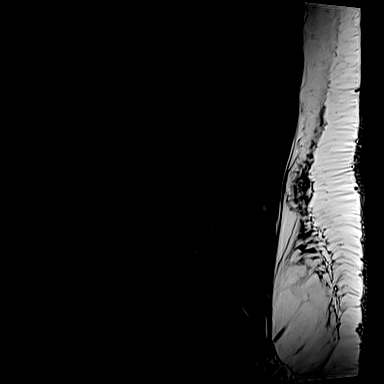

[Series 11: T1 · axial · 4.0mm · 0.28mm/px · z∈[-91,+74]mm · 3 of 41 slices shown (2 of 2)]
[im 6/41]
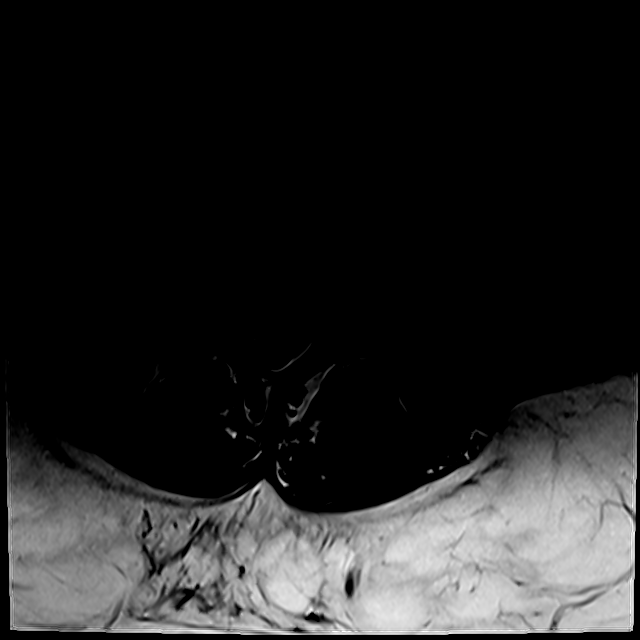
[im 21/41]
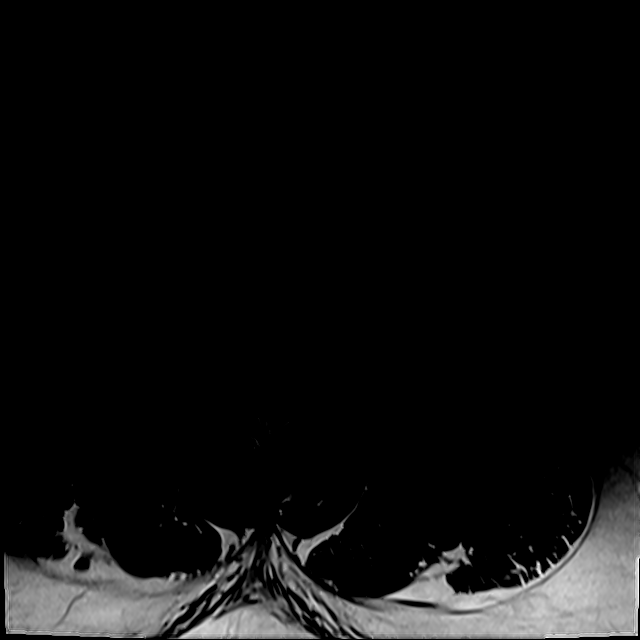
[im 35/41]
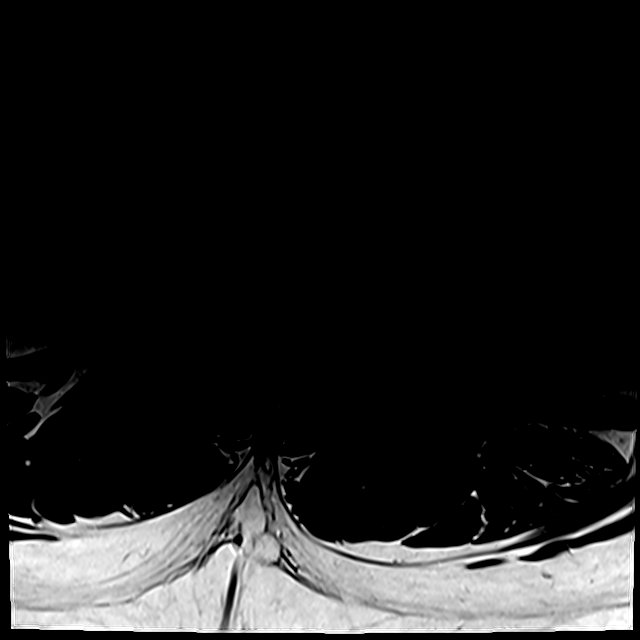

[Series 14: T2 · axial · 4.0mm · 0.28mm/px · z∈[-115,+74]mm · 6 of 41 slices shown (2 of 2)]
[im 1/41]
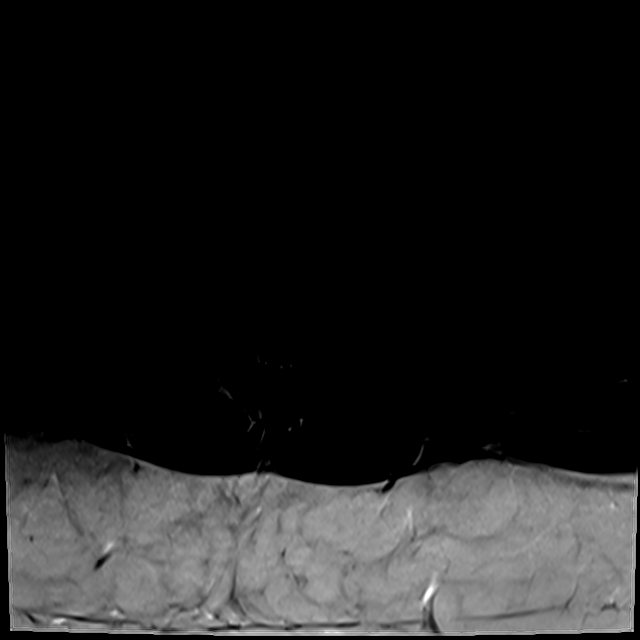
[im 6/41]
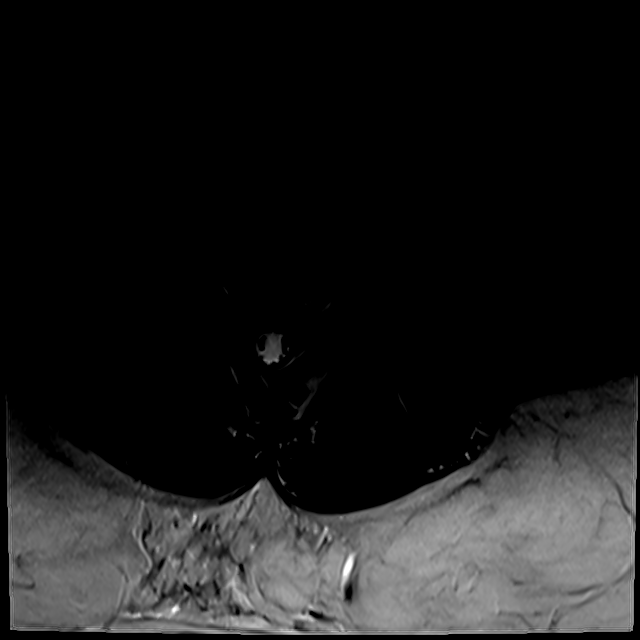
[im 12/41]
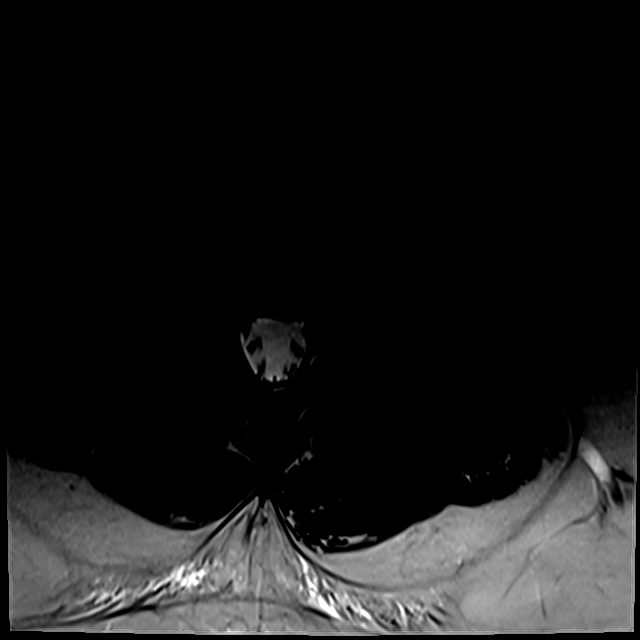
[im 18/41]
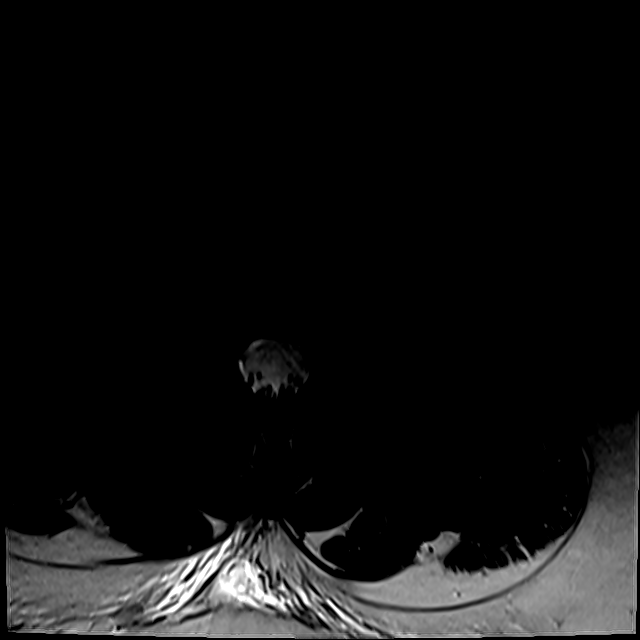
[im 21/41]
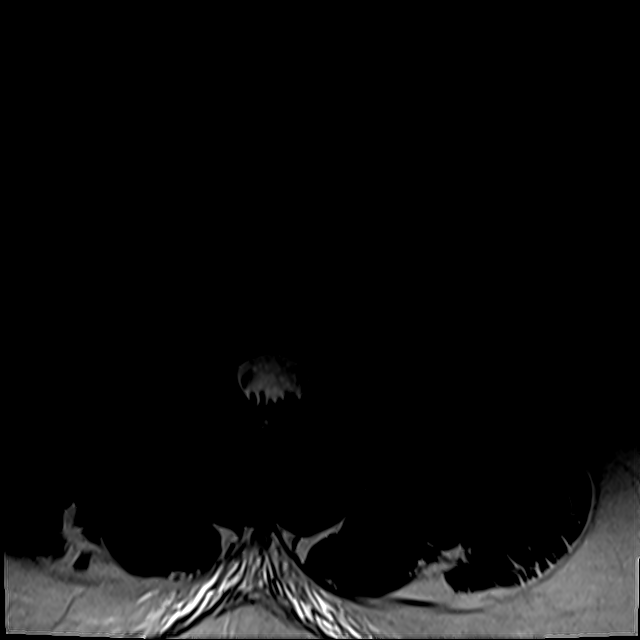
[im 35/41]
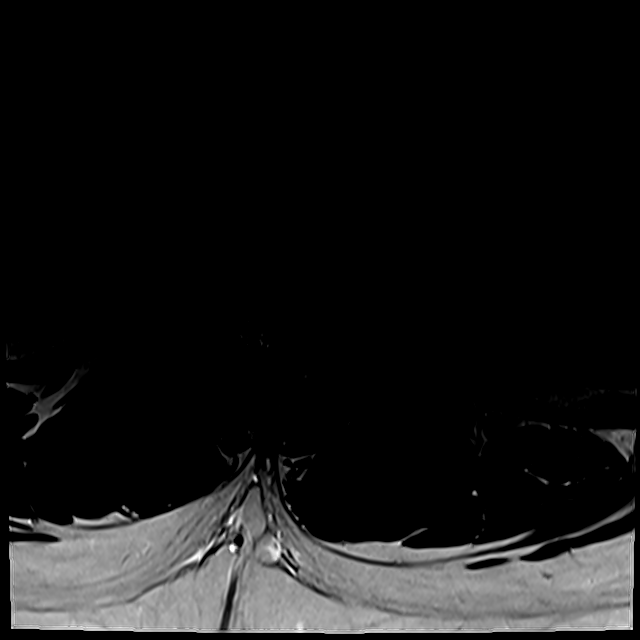

[18 of 48 positions shown; findings below may reference images not displayed]

FINDINGS: Segmentation: Standard. Lowest well-formed disc space labeled the
L5-S1 level.

Alignment: Physiologic with preservation of the normal lumbar
lordosis. No listhesis.

Vertebrae: Vertebral body height maintained without evidence for
acute or chronic fracture. Bone marrow signal intensity diffusely
decreased on T1 weighted imaging, nonspecific, but most commonly
related to anemia, smoking, or obesity. No discrete or worrisome
osseous lesions. Discogenic reactive endplate changes present about
the L4-5 interspace. No other abnormal marrow edema.

Conus medullaris and cauda equina: Conus extends to the T12-L1
level. Conus and cauda equina appear normal.

Paraspinal and other soft tissues: Paraspinous soft tissues within
normal limits. Visualized visceral structures are normal.

Disc levels:

L1-2:  Unremarkable.

L2-3:  Unremarkable.

L3-4:  Unremarkable.

L4-5: Disc desiccation with minimal annular disc bulge. Reactive
endplate changes with marginal endplate osteophytic spurring and
marrow edema. Mild facet hypertrophy. No significant canal or
lateral recess stenosis. Mild left L4 foraminal narrowing. No
significant right foraminal stenosis.

L5-S1: Disc desiccation without significant disc bulge. Minimal
reactive endplate changes. Mild facet hypertrophy. Epidural
lipomatosis. No significant spinal stenosis. Mild bilateral L5
foraminal stenosis.
IMPRESSION: 1. Degenerative disc disease with reactive endplate changes and
facet hypertrophy at L4-5 with resultant mild left L4 foraminal
stenosis. No impingement.
2. Disc desiccation with reactive endplate changes and facet
hypertrophy at L5-S1 with resultant mild bilateral L5 foraminal
stenosis.
